# Patient Record
Sex: Female | Born: 1955 | Race: White | Hispanic: Yes | Marital: Married | Smoking: Never smoker
Health system: Southern US, Community
[De-identification: ages and names within clinical notes are randomized; demographics above are authoritative.]

## PROBLEM LIST (undated history)

## (undated) DIAGNOSIS — F32A Depression, unspecified: Secondary | ICD-10-CM

## (undated) HISTORY — PX: CARPAL TUNNEL RELEASE: SHX101

## (undated) HISTORY — PX: OTHER SURGICAL HISTORY: SHX169

## (undated) HISTORY — DX: Depression, unspecified: F32.A

## (undated) HISTORY — PX: ABDOMINAL HYSTERECTOMY: SHX81

---

## 2015-04-22 DIAGNOSIS — M7502 Adhesive capsulitis of left shoulder: Secondary | ICD-10-CM

## 2015-04-22 HISTORY — DX: Adhesive capsulitis of left shoulder: M75.02

## 2020-08-01 ENCOUNTER — Ambulatory Visit
Admission: RE | Admit: 2020-08-01 | Discharge: 2020-08-01 | Disposition: A | Discharge status: Home / Self Care | Payer: BC Managed Care – PPO | Attending: Pain Medicine | Admitting: Pain Medicine

## 2020-08-01 ENCOUNTER — Ambulatory Visit
Admission: RE | Admit: 2020-08-01 | Discharge: 2020-08-01 | Disposition: A | Discharge status: Home / Self Care | Payer: BC Managed Care – PPO | Source: Ambulatory Visit | Attending: Pain Medicine | Admitting: Pain Medicine

## 2020-08-01 ENCOUNTER — Encounter: Payer: Self-pay | Admitting: Pain Medicine

## 2020-08-01 ENCOUNTER — Ambulatory Visit (HOSPITAL_BASED_OUTPATIENT_CLINIC_OR_DEPARTMENT_OTHER): Payer: BC Managed Care – PPO | Admitting: Pain Medicine

## 2020-08-01 VITALS — BP 124/83 | HR 62 | Temp 97.3°F | Resp 18 | Ht 61.0 in | Wt 150.0 lb

## 2020-08-01 DIAGNOSIS — M7542 Impingement syndrome of left shoulder: Secondary | ICD-10-CM

## 2020-08-01 DIAGNOSIS — M79652 Pain in left thigh: Secondary | ICD-10-CM | POA: Insufficient documentation

## 2020-08-01 DIAGNOSIS — R29898 Other symptoms and signs involving the musculoskeletal system: Secondary | ICD-10-CM | POA: Insufficient documentation

## 2020-08-01 DIAGNOSIS — R200 Anesthesia of skin: Secondary | ICD-10-CM

## 2020-08-01 DIAGNOSIS — S43422A Sprain of left rotator cuff capsule, initial encounter: Secondary | ICD-10-CM | POA: Insufficient documentation

## 2020-08-01 DIAGNOSIS — M25512 Pain in left shoulder: Secondary | ICD-10-CM

## 2020-08-01 DIAGNOSIS — M75102 Unspecified rotator cuff tear or rupture of left shoulder, not specified as traumatic: Secondary | ICD-10-CM | POA: Insufficient documentation

## 2020-08-01 DIAGNOSIS — M545 Low back pain, unspecified: Secondary | ICD-10-CM | POA: Insufficient documentation

## 2020-08-01 DIAGNOSIS — G8929 Other chronic pain: Secondary | ICD-10-CM | POA: Insufficient documentation

## 2020-08-01 DIAGNOSIS — S43429A Sprain of unspecified rotator cuff capsule, initial encounter: Secondary | ICD-10-CM | POA: Insufficient documentation

## 2020-08-01 DIAGNOSIS — M25552 Pain in left hip: Secondary | ICD-10-CM | POA: Insufficient documentation

## 2020-08-01 DIAGNOSIS — Z9889 Other specified postprocedural states: Secondary | ICD-10-CM | POA: Insufficient documentation

## 2020-08-01 DIAGNOSIS — G5602 Carpal tunnel syndrome, left upper limb: Secondary | ICD-10-CM | POA: Insufficient documentation

## 2020-08-01 DIAGNOSIS — IMO0002 Reserved for concepts with insufficient information to code with codable children: Secondary | ICD-10-CM

## 2020-08-01 HISTORY — DX: Unspecified rotator cuff tear or rupture of left shoulder, not specified as traumatic: M75.102

## 2020-08-01 HISTORY — DX: Impingement syndrome of left shoulder: M75.42

## 2020-08-01 HISTORY — DX: Reserved for concepts with insufficient information to code with codable children: IMO0002

## 2020-08-01 HISTORY — DX: Carpal tunnel syndrome, left upper limb: G56.02

## 2020-08-01 HISTORY — DX: Pain in left shoulder: M25.512

## 2020-08-01 MED ORDER — GABAPENTIN 100 MG PO CAPS: 100.0000 mg | ORAL_CAPSULE | Freq: Every day | ORAL | 0 refills | Status: AC

## 2020-08-01 MED ORDER — PREDNISONE 20 MG PO TABS: ORAL_TABLET | ORAL | 0 refills | Status: AC

## 2020-08-01 NOTE — Progress Notes (Signed)
Patient: Sherri Schroeder  Service Category: E/M  Provider: Gaspar Cola, MD  DOB: 11/23/1955  DOS: 08/01/2020  Referring Provider: No ref. provider found  MRN: [id number]  Setting: Ambulatory outpatient  PCP: Pcp, No  Type: New Patient  Specialty: Interventional Pain Management    Location: Office  Delivery: Face-to-face     Primary Reason(s) for Visit: Encounter for initial evaluation of one or more chronic problems (new to examiner) potentially causing chronic pain, and posing a threat to normal musculoskeletal function. (Level of risk: High) CC: Back Pain (Lower left) and Leg Pain (Left leg)  HPI  Sherri Schroeder is a 65 y.o. year old, female patient, who comes for the first time to our practice referred by No ref. provider found for our initial evaluation of her chronic pain. She has Acute hip pain (Left); Acute thigh pain (Left); Numbness of anterior thigh (Left); Chronic low back pain (intermittent) (Right) w/o sciatica; Weakness of lower extremity (intermittent) (Left); History of carpal tunnel surgery of wrist (Left); Midline low back pain w/o sciatica; and Sprain of rotator cuff capsule (Left) on their problem list. Today she comes in for evaluation of her Back Pain (Lower left) and Leg Pain (Left leg)  Pain Assessment: Location: Left Leg Radiating: radiates from left lower back down left leg to knee Onset: More than a month ago Duration: Chronic pain Quality: Aching, Burning, Spasm, Numbness Severity: 3 /10 (subjective, self-reported pain score)  Effect on ADL: limits activities Timing: Intermittent Modifying factors: rest BP: 124/83  HR: 62  Onset and Duration: Sudden, Date of onset: 07/30/2020, and Present less than 3 months Cause of pain:  Picking up her grandchild. Severity: NAS-11 at its worse: 9/10 Timing: Not influenced by the time of the day Aggravating Factors: Walking Alleviating Factors: Resting Associated Problems: Numbness Quality of Pain: Numb Previous  Examinations or Tests: The patient denies treatment Previous Treatments: The patient denies treatment  According to the patient, a couple of days ago she was lifting her grandchild and after that she began to experience pain in the lower back and left lower extremity.  She tried taking some over-the-counter medications but nothing seems to relieve the pain.  The primary area of pain is that of the left anterior thigh with constant numbness, intermittent weakness, and pain that worsens with prolonged standing.  Sitting and laying down seems to ease the pain, but numbness does not seem to be going away.  She denies any pain numbness or weakness at or below the knee level on either side.  Physical exam: Negative straight leg raise for reproduction of any lower extremity pain.  However straight leg raise of the left lower extremity seems to trigger pain in the right lower back.  She indicates having a longstanding history of intermittent right lower back pain that she normally treats with over-the-counter medications.  The patient was able to toe walk and heel walk without any problems.  Patrick maneuver was negative on the right side but positive on the left side for hip joint arthralgia with decreased range of motion.  Hyperextension maneuver of the lumbar spine trigger pain in the midline of the lower back without radiation towards lower extremities.  Hyperextension and rotation of the lumbar spine was positive for right-sided lumbar facet arthralgia.  Deep tendon reflexes were +3 and equal bilaterally for the patellar tendon and +2 and equal bilaterally for the Achilles tendon.  Historic Controlled Substance Pharmacotherapy Review  PMP and historical list of controlled substances: Oxycodone/APAP 5/325 for  postop pain after hysterectomy. Current opioid analgesics: None MME/day: 0 mg/day   Historical Background Evaluation: [place] PMP: PDMP reviewed during this encounter. Online review of the past 64-month period conducted.             PMP NARX Score Report:  Narcotic: 000 Sedative: 000 Stimulant: 000  Risk Assessment Profile: Personal History of Substance Abuse (SUD-Substance use disorder):  Alcohol: Negative  Illegal Drugs: Negative  Rx Drugs: Negative  ORT Risk Level calculation: Low Risk   Opioid Risk Tool - 08/01/20 1040       Family History of Substance Abuse   Alcohol Negative    Illegal Drugs Negative    Rx Drugs Negative      Personal History of Substance Abuse   Alcohol Negative    Illegal Drugs Negative    Rx Drugs Negative      Age   Age between 111-45years  No      History of Preadolescent Sexual Abuse   History of Preadolescent Sexual Abuse Negative or Female      Psychological Disease   Psychological Disease Negative    Depression Positive      Total Score   Opioid Risk Tool Scoring 1    Opioid Risk Interpretation Low Risk            ORT Scoring interpretation table:  Score <3 = Low Risk for SUD  Score between 4-7 = Moderate Risk for SUD  Score >8 = High Risk for Opioid Abuse   PHQ-2 Depression Scale:  Total score: 0  PHQ-2 Scoring interpretation table: (Score and probability of major depressive disorder)  Score 0 = No depression  Score 1 = 15.4% Probability  Score 2 = 21.1% Probability  Score 3 = 38.4% Probability  Score 4 = 45.5% Probability  Score 5 = 56.4% Probability  Score 6 = 78.6% Probability   PHQ-9 Depression Scale:  Total score: 0  PHQ-9 Scoring interpretation table:  Score 0-4 = No depression  Score 5-9 = Mild depression  Score 10-14 = Moderate depression  Score 15-19 = Moderately severe depression  Score 20-27 = Severe depression (2.4 times higher risk of SUD and 2.89 times higher risk of overuse)   Meds   Current Outpatient Medications:    buPROPion (WELLBUTRIN XL) 300 MG 24 hr tablet, Take 300 mg by mouth daily., Disp: , Rfl:    gabapentin (NEURONTIN) 100 MG capsule, Take 1-3 capsules (100-300 mg total) by mouth at  bedtime. Follow written titration schedule., Disp: 90 capsule, Rfl: 0   levothyroxine (SYNTHROID) 125 MCG tablet, Take 125 mcg by mouth daily before breakfast., Disp: , Rfl:    predniSONE (DELTASONE) 20 MG tablet, Take 3 tablets (60 mg total) by mouth daily with breakfast for 3 days, THEN 2 tablets (40 mg total) daily with breakfast for 3 days, THEN 1 tablet (20 mg total) daily with breakfast for 3 days., Disp: 18 tablet, Rfl: 0   bimatoprost (LATISSE) 0.03 % ophthalmic solution, SMARTSIG:Sparingly In Eye(s) Every Night, Disp: , Rfl:    Estradiol-Norethindrone Acet 0.5-0.1 MG tablet, estradiol-norethindrone acet 0.5 mg-0.1 mg tablet, Disp: , Rfl:   Imaging Review   Complexity Note: [place]               ROS  Cardiovascular: No reported cardiovascular signs or symptoms such as High blood pressure, coronary artery disease, abnormal heart rate or rhythm, heart attack, blood thinner therapy or heart  weakness and/or failure Pulmonary or Respiratory: No reported pulmonary signs or symptoms such as wheezing and difficulty taking a deep full breath (Asthma), difficulty blowing air out (Emphysema), coughing up mucus (Bronchitis), persistent dry cough, or temporary stoppage of breathing during sleep Neurological: No reported neurological signs or symptoms such as seizures, abnormal skin sensations, urinary and/or fecal incontinence, being born with an abnormal open spine and/or a tethered spinal cord Psychological-Psychiatric: Depressed Gastrointestinal: No reported gastrointestinal signs or symptoms such as vomiting or evacuating blood, reflux, heartburn, alternating episodes of diarrhea and constipation, inflamed or scarred liver, or pancreas or irrregular and/or infrequent bowel movements Genitourinary: No reported renal or genitourinary signs or symptoms such as difficulty voiding or producing urine, peeing blood, non-functioning kidney, kidney  stones, difficulty emptying the bladder, difficulty controlling the flow of urine, or chronic kidney disease Hematological: No reported hematological signs or symptoms such as prolonged bleeding, low or poor functioning platelets, bruising or bleeding easily, hereditary bleeding problems, low energy levels due to low hemoglobin or being anemic Endocrine: No reported endocrine signs or symptoms such as high or low blood sugar, rapid heart rate due to high thyroid levels, obesity or weight gain due to slow thyroid or thyroid disease Rheumatologic: No reported rheumatological signs and symptoms such as fatigue, joint pain, tenderness, swelling, redness, heat, stiffness, decreased range of motion, with or without associated rash Musculoskeletal: Negative for myasthenia gravis, muscular dystrophy, multiple sclerosis or malignant hyperthermia Work History: Working full time  Allergies  Sherri Schroeder is allergic to penicillins.  Laboratory Chemistry Profile   Renal No results found for: BUN, CREATININE, LABCREA, BCR, GFR, GFRAA, GFRNONAA, SPECGRAV, PHUR, PROTEINUR   Electrolytes No results found for: NA, K, CL, CALCIUM, MG, PHOS   Hepatic No results found for: AST, ALT, ALBUMIN, ALKPHOS, AMYLASE, LIPASE, AMMONIA   ID No results found for: LYMEIGGIGMAB, HIV, SARSCOV2NAA, STAPHAUREUS, MRSAPCR, HCVAB, PREGTESTUR, RMSFIGG, QFVRPH1IGG, QFVRPH2IGG, LYMEIGGIGMAB   Bone No results found for: VD25OH, RW431VQ0GQQ, PY1950DT2, IZ1245YK9, 25OHVITD1, 25OHVITD2, 25OHVITD3, TESTOFREE, TESTOSTERONE   Endocrine No results found for: GLUCOSE, GLUCOSEU, HGBA1C, TSH, FREET4, TESTOFREE, TESTOSTERONE, SHBG, ESTRADIOL, ESTRADIOLPCT, ESTRADIOLFRE, LABPREG, ACTH, CRTSLPL, UCORFRPERLTR, UCORFRPERDAY, CORTISOLBASE, LABPREG   Neuropathy No results found for: VITAMINB12, FOLATE, HGBA1C, HIV   CNS No results found for: COLORCSF, APPEARCSF, RBCCOUNTCSF, WBCCSF, POLYSCSF, LYMPHSCSF, EOSCSF, PROTEINCSF, GLUCCSF, JCVIRUS,  CSFOLI, IGGCSF, LABACHR, ACETBL, LABACHR, ACETBL   Inflammation (CRP: Acute  ESR: Chronic) No results found for: CRP, ESRSEDRATE, LATICACIDVEN   Rheumatology No results found for: RF, ANA, LABURIC, URICUR, LYMEIGGIGMAB, LYMEABIGMQN, HLAB27   Coagulation No results found for: INR, LABPROT, APTT, PLT, DDIMER, LABHEMA, VITAMINK1, AT3   Cardiovascular No results found for: BNP, CKTOTAL, CKMB, TROPONINI, HGB, HCT, LABVMA, EPIRU, EPINEPH24HUR, NOREPRU, NOREPI24HUR, DOPARU, DOPAM24HRUR   Screening No results found for: [place], COVIDSOURCE, STAPHAUREUS, MRSAPCR, HCVAB, HIV, PREGTESTUR   Cancer No results found for: CEA, CA125, LABCA2   Allergens No results found for: ALMOND, Schroeder, ASPARAGUS, AVOCADO, BANANA, BARLEY, BASIL, BAYLEAF, GREENBEAN, LIMABEAN, WHITEBEAN, BEEFIGE, REDBEET, BLUEBERRY, BROCCOLI, CABBAGE, MELON, CARROT, CASEIN, CASHEWNUT, CAULIFLOWER, CELERY     Note: No results found under the [employer] electronic medical record  [facility]  Drug: Sherri Schroeder  reports no history of drug use. Alcohol:  reports current alcohol use. Tobacco:  has no history on file for tobacco use. 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 Family: family history includes COPD in her mother; Cancer in her father and mother; Heart disease in her mother.  Past Surgical History:  Procedure Laterality Date   ABDOMINAL HYSTERECTOMY     ataracts Bilateral    CARPAL TUNNEL RELEASE Left    Active Ambulatory Problems    Diagnosis Date Noted   Acute hip pain (Left) 08/01/2020   Acute thigh pain (Left) 08/01/2020   Numbness of anterior thigh (Left) 08/01/2020   Chronic low back pain (intermittent) (Right) w/o sciatica  08/01/2020   Weakness of lower extremity (intermittent) (Left) 08/01/2020   History of carpal tunnel surgery of wrist (Left) 08/01/2020   Midline low back pain w/o sciatica 08/01/2020   Sprain of rotator cuff capsule (Left) 08/01/2020   Resolved Ambulatory Problems    Diagnosis Date Noted   Rotator cuff syndrome of shoulder and allied disorders (Left) 08/01/2020   Sprain of rotator cuff capsule 08/01/2020   Adhesive capsulitis of shoulder (Left) 04/22/2015   Impingement syndrome of shoulder region (Left) 08/01/2020   Acute pain of shoulder (Left) 08/01/2020   Carpal tunnel syndrome (Left) 08/01/2020   Past Medical History:  Diagnosis Date   Depression    Constitutional Exam  General appearance: Well nourished, well developed, and well hydrated. In no apparent acute distress Vitals:   08/01/20 1031  BP: 124/83  Pulse: 62  Resp: 18  Temp: (!) 97.3 F (36.3 C)  SpO2: 96%  Weight: 150 lb (68 kg)  Height: _0  (1.549 m)   BMI Assessment: Estimated body mass index is 28.34 kg/m as calculated from the following:   Height as of this encounter: _1  (1.549 m).   Weight as of this encounter: 150 lb (68 kg).  BMI interpretation table: BMI level Category Range association with higher incidence of chronic pain  <18 kg/m2 Underweight   18.5-24.9 kg/m2 Ideal body weight   25-29.9 kg/m2 Overweight Increased incidence by 20%  30-34.9 kg/m2 Obese (Class I) Increased incidence by 68%  35-39.9 kg/m2 Severe obesity (Class II) Increased incidence by 136%  >40 kg/m2 Extreme obesity (Class III) Increased incidence by 254%   Patient's current BMI Ideal Body weight  Body mass index is 28.34 kg/m. Ideal body weight: 47.8 kg (105 lb 6.1 oz) Adjusted ideal body weight: 55.9 kg (123 lb 3.7 oz)   BMI Readings from Last 4 Encounters:  08/01/20 28.34 kg/m   Wt Readings from Last 4 Encounters:  08/01/20 150 lb (68 kg)    Psych/Mental status: Alert, oriented x 3 (person, place, & time)        Eyes: PERLA Respiratory: No evidence of acute respiratory distress  Assessment  Primary Diagnosis & Pertinent Problem List: The primary encounter diagnosis was Numbness of left anterior thigh. Diagnoses of Acute thigh pain, left, Acute hip pain, left, Weakness of left lower extremity, Acute midline low back pain without sciatica, and History of carpal tunnel surgery of left wrist were also pertinent to this visit.  Visit Diagnosis (New problems to examiner): 1. Numbness of left anterior thigh   2. Acute thigh pain, left   3. Acute hip pain, left   4. Weakness of left lower extremity   5. Acute midline low back pain without sciatica   6. History of carpal tunnel surgery of left wrist    Plan of Care (Initial workup plan)  Note: Sherri Schroeder was reminded that as per protocol, today's visit has been an evaluation only. We have  not taken over the patient's controlled substance management.  Problem-specific plan: No problem-specific Assessment & Plan notes found for this encounter. Lab Orders  No laboratory test(s) ordered today   Imaging Orders  DG HIP UNILAT W OR W/O PELVIS 2-3 VIEWS LEFT  DG Lumbar Spine Complete W/Bend   Referral Orders  No referral(s) requested today   Procedure Orders    No procedure(s) ordered today   Pharmacotherapy (current): Medications ordered:  Meds ordered this encounter  Medications   predniSONE (DELTASONE) 20 MG tablet    Sig: Take 3 tablets (60 mg total) by mouth daily with breakfast for 3 days, THEN 2 tablets (40 mg total) daily with breakfast for 3 days, THEN 1 tablet (20 mg total) daily with breakfast for 3 days.    Dispense:  18 tablet    Refill:  0   gabapentin (NEURONTIN) 100 MG capsule    Sig: Take 1-3 capsules (100-300 mg total) by mouth at bedtime. Follow written titration schedule.    Dispense:  90 capsule    Refill:  0    Fill one day early if pharmacy is closed on scheduled refill date. May substitute for generic if available.     Medications administered during this visit: Sherri Schroeder had no medications administered during this visit.   Pharmacological management options:  Opioid Analgesics: The patient was informed that there is no guarantee that she would be a candidate for opioid analgesics. The decision will be made following CDC guidelines. This decision will be based on the results of diagnostic studies, as well as Sherri Schroeder's risk profile.   Membrane stabilizer: To be determined at a later time  Muscle relaxant: To be determined at a later time  NSAID: To be determined at a later time  Other analgesic(s): To be determined at a later time   Interventional management options: Sherri Schroeder was informed that there is no guarantee that she would be a candidate for interventional therapies. The decision will be based on the results of diagnostic studies, as well as Sherri Schroeder's risk profile.  Procedure(s) under consideration:  Pending results of x-rays.   Provider-requested follow-up: Return if symptoms worsen or fail to improve.  No future appointments.   Note by: Gaspar Cola, MD Date: 08/01/2020; Time: 12:05 PM

## 2020-08-01 NOTE — Progress Notes (Signed)
Safety precautions to be maintained throughout the outpatient stay will include: orient to surroundings, keep bed in low position, maintain call bell within reach at all times, provide assistance with transfer out of bed and ambulation.  

## 2020-08-01 NOTE — Patient Instructions (Signed)
____________________________________________________________________________________________  Pain Prevention Technique  Definition:   A technique used to minimize the effects of an activity known to cause inflammation or swelling, which in turn leads to an increase in pain.  Purpose: To prevent swelling from occurring. It is based on the fact that it is easier to prevent swelling from happening than it is to get rid of it, once it occurs.  Contraindications: Anyone with allergy or hypersensitivity to the recommended medications. Anyone taking anticoagulants (Blood Thinners) (e.g., Coumadin, Warfarin, Plavix, etc.). Patients in Renal Failure.  Technique: Before you undertake an activity known to cause pain, or a flare-up of your chronic pain, and before you experience any pain, do the following:  On a full stomach, take 4 (four) over the counter Ibuprofens 200mg  tablets (Motrin), for a total of 800 mg. In addition, take over the counter Magnesium 400 to 500 mg, before doing the activity.  Six (6) hours later, again on a full stomach, repeat the Ibuprofen. That night, take a warm shower and stretch under the running warm water.  This technique may be sufficient to abort the pain and discomfort before it happens. Keep in mind that it takes a lot less medication to prevent swelling than it takes to eliminate it once it occurs.  ____________________________________________________________________________________________  ____________________________________________________________________________________________  Muscle Spasms & Cramps  Cause:  The most common cause of muscle spasms and cramps is vitamin and/or electrolyte (calcium, potassium, sodium, etc.) deficiencies.  Possible triggers: Sweating - causes loss of electrolytes thru the skin. Steroids - causes loss of electrolytes thru the urine.  Treatment: Gatorade (or any other electrolyte-replenishing drink) - Take 1, 8 oz glass  with each meal (3 times a day). OTC (over-the-counter) Magnesium 400 to 500 mg - Take 1 tablet twice a day (one with breakfast and one before bedtime). If you have kidney problems, talk to your primary care physician before taking any Magnesium. Tonic Water with quinine - Take 1, 8 oz glass before bedtime.   ____________________________________________________________________________________________  ____________________________________________________________________________________________  Virtual Visits   What is a "Virtual Visit"? It is a (medical visit) that takes place on real time (NOT TEXT or E-MAIL) over the telephone or computer device (desktop, laptop, tablet, smart phone, etc.). It allows for more location flexibility between the patient and the healthcare provider.  Who decides when these types of visits will be used? The physician.  Who is eligible for these types of visits? Only those patients that can be reliably reached over the telephone.  What do you mean by reliably? We do not have time to call everyone multiple times, therefore those that tend to screen calls and then call back later are not suitable candidates for this system. We understand how people are reluctant to pickup on "unknown" calls, therefore, we suggest adding our telephone numbers to your list of "CONTACT(s)". This way, you should be able to readily identify our calls when you receive one. All of our numbers are available below.   Who is not eligible? This option is not available for medication management encounters, specially for controlled substances. Patients on pain medications that fall under the category of controlled substances have to come in for "Face-to-Face" encounters. This is required for mandatory monitoring of these substances. You may be asked to provide a sample for an unannounced urine drug screening test (UDS), and we will need to count your pain pills.  Not bringing your pills to be counted may result in no refill. Obviously, neither one of  these can be done over the phone.  When will this type of visits be used? You can request a virtual visit whenever you are physically unable to attend a regular appointment. The decision will be made by the physician (or healthcare provider) on a case by case basis.   At what time will I be called? This is an excellent question. The providers will try to call you whenever they have time available. Do not expect to be called at any specific time. The secretaries will assign you a time for your virtual visit appointment, but this is done simply to keep a list of those patients that need to be called, but not for the purpose of keeping a time schedule. Be advised that the call may come in anytime during the day, between the hours of 8:00 AM and 8::00 PM, depending on provider availability. We do understand that the system is not perfect. If you are unable to be available that day on a moments notice, then request an "in-person" appointment rather than a "virtual visit".  Can I request my medication visits to be "Virtual"? Yes you may request it, but the decision is entirely up to the healthcare provider. Control substances require specific monitoring that requires Face-to-Face encounters. The number of encounters  and the extent of the monitoring is determined on a case by case basis.  Add a new contact to your smart phone and label it "PAIN CLINIC" Under this contact add the following numbers: Main: [phone] (Official Contact Number) Nurses: [phone] (These are outgoing only calling systems. Do not call this number.) Dr. Laban Emperor: [phone] or [phone] (Outgoing calls only. Do not call this number.)  ____________________________________________________________________________________________

## 2020-08-07 ENCOUNTER — Other Ambulatory Visit: Payer: Self-pay | Admitting: Pain Medicine

## 2020-08-07 DIAGNOSIS — M5410 Radiculopathy, site unspecified: Secondary | ICD-10-CM | POA: Insufficient documentation

## 2020-08-07 DIAGNOSIS — R29898 Other symptoms and signs involving the musculoskeletal system: Secondary | ICD-10-CM

## 2020-08-07 DIAGNOSIS — M47817 Spondylosis without myelopathy or radiculopathy, lumbosacral region: Secondary | ICD-10-CM | POA: Insufficient documentation

## 2020-08-07 DIAGNOSIS — M5416 Radiculopathy, lumbar region: Secondary | ICD-10-CM | POA: Insufficient documentation

## 2020-08-07 DIAGNOSIS — M5137 Other intervertebral disc degeneration, lumbosacral region: Secondary | ICD-10-CM

## 2020-08-07 DIAGNOSIS — M25552 Pain in left hip: Secondary | ICD-10-CM

## 2020-08-07 DIAGNOSIS — M79652 Pain in left thigh: Secondary | ICD-10-CM

## 2020-08-07 DIAGNOSIS — M47816 Spondylosis without myelopathy or radiculopathy, lumbar region: Secondary | ICD-10-CM

## 2020-08-07 DIAGNOSIS — R208 Other disturbances of skin sensation: Secondary | ICD-10-CM

## 2020-08-07 DIAGNOSIS — R200 Anesthesia of skin: Secondary | ICD-10-CM

## 2020-08-07 NOTE — Progress Notes (Addendum)
X-rays reviewed. Patient called. Symptoms have decreased in frequency, but they persist, despite a round of oral steroids. At this point an MRI is needed to evaluate for soft tissue pathology. Patient is experiencing pain and numbness with sensory disturbance on her left thigh region following an L2/L3 dermatomal distribution. She is experiencing intermittent weakness associated with pain episodes which are triggered with prolonged standing. Pain appears after more than 10 minutes of standing. This period is shortened when lifting any weight or movement of lumbar spine. Symptoms are highly suspicious for a far left disc herniation at the L1-2 thru L3-4 region. MRI w/o contrast is medically indicated to assess degree of neural structure compression and possible need for surgical intervention.  Imaging Review  Lumbosacral Imaging: Lumbar DG Bending views: Results for orders placed during the hospital encounter of 08/01/20 DG Lumbar Spine Complete W/Bend  Narrative CLINICAL DATA:  Low back pain  EXAM: LUMBAR SPINE - COMPLETE WITH BENDING VIEWS  COMPARISON:  None.  FINDINGS: 5 nonrib bearing lumbar-type vertebral bodies.  Vertebral body heights are maintained. No acute fracture. Generalized osteopenia.  No static listhesis. No spondylolysis.  Degenerative disease with disc height loss at L2-3, L3-4, L4-5 and L5-S1. Bilateral facet arthropathy at L4-5 and L5-S1.  SI joints are unremarkable.  IMPRESSION: Lumbar spine spondylosis as described above.   Electronically Signed By: Elige Ko On: 08/03/2020 08:07  Hip Imaging: Hip-L DG 2-3 views: Results for orders placed during the hospital encounter of 08/01/20 DG HIP UNILAT W OR W/O PELVIS 2-3 VIEWS LEFT  Narrative CLINICAL DATA:  Left hip pain for 1 year  EXAM: DG HIP (WITH OR WITHOUT PELVIS) 2-3V LEFT  COMPARISON:  None.  FINDINGS: Bones: No acute fracture or dislocation. Generalized osteopenia. No periostitis.  Enthesopathic changes at the iliac crest bilaterally.  Joints: Normal alignment. No erosive changes. Joint spaces are maintained.  Soft tissue: No soft tissue abnormality. No radiopaque foreign body. No chondrocalcinosis.  IMPRESSION: No significant arthropathy of the left hip.   Electronically Signed By: Elige Ko On: 08/03/2020 08:06  Complexity Note: Imaging results reviewed. Results shared with Sherri Schroeder, using Layman's terms.                         Assessment   Status Diagnosis  Persistent Persistent Not responding 1. Acute thigh pain (Left)   2. Radicular pain of lower extremity (Left)   3. Acute lumbar radiculopathy (Left)   4. Acute hip pain (Left)   5. DDD (degenerative disc disease), lumbosacral   6. Sensory abnormality of lumbar dermatome distribution (Left: L2/L3 Dermatomes)   7. Numbness of anterior thigh (Left)   8. Weakness of lower extremity (intermittent) (Left)   9. Lumbar spondylosis   10. Lumbosacral facet arthropathy (Multilevel) (Bilateral)      Updated Problems: Problem  Ddd (Degenerative Disc Disease), Lumbosacral   Degenerative disease with disc height loss at L2-3, L3-4, L4-5 and L5-S1.    Lumbosacral facet arthropathy (Multilevel) (Bilateral)   Bilateral facet arthropathy at L4-5 and L5-S1.    Lumbosacral facet joint syndrome (Right)  Lumbar Spondylosis   Degenerative disease with disc height loss at L2-3, L3-4, L4-5 and L5-S1. Bilateral facet arthropathy at L4-5 and L5-S1.    Sensory abnormality of lumbar dermatome distribution (Left: L2/L3 Dermatomes)  Acute lumbar radiculopathy (Left)  Radicular pain of lower extremity (Left)

## 2022-07-07 IMAGING — CR DG LUMBAR SPINE COMPLETE W/ BEND
7 series · 7 of 7 positions shown · non-contrast
Comparison: None.

CLINICAL DATA: Low back pain

EXAM:
LUMBAR SPINE - COMPLETE WITH BENDING VIEWS

[l-spine ap]
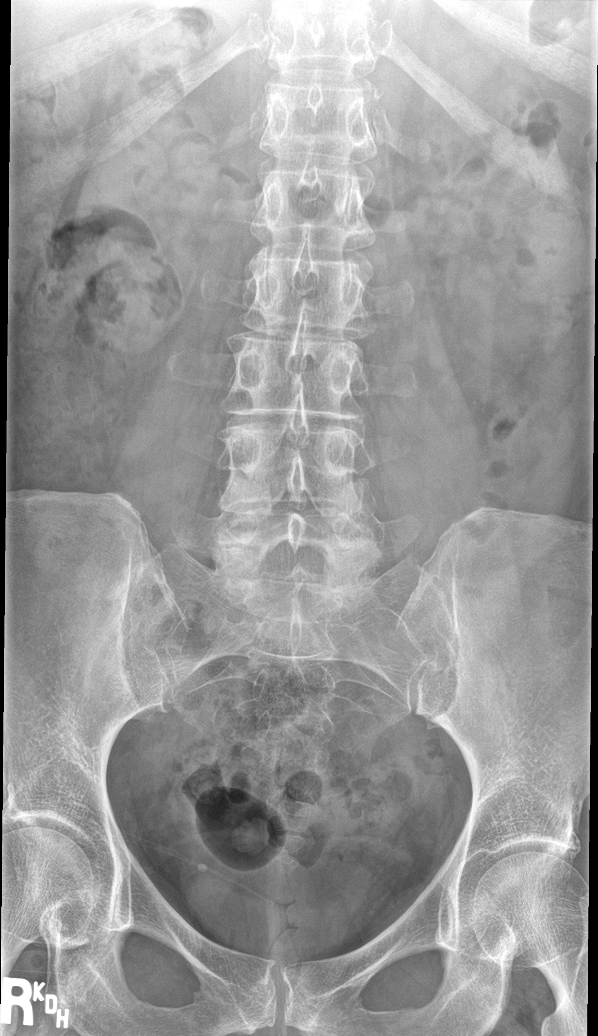

[l-spine obl (1 of 2)]
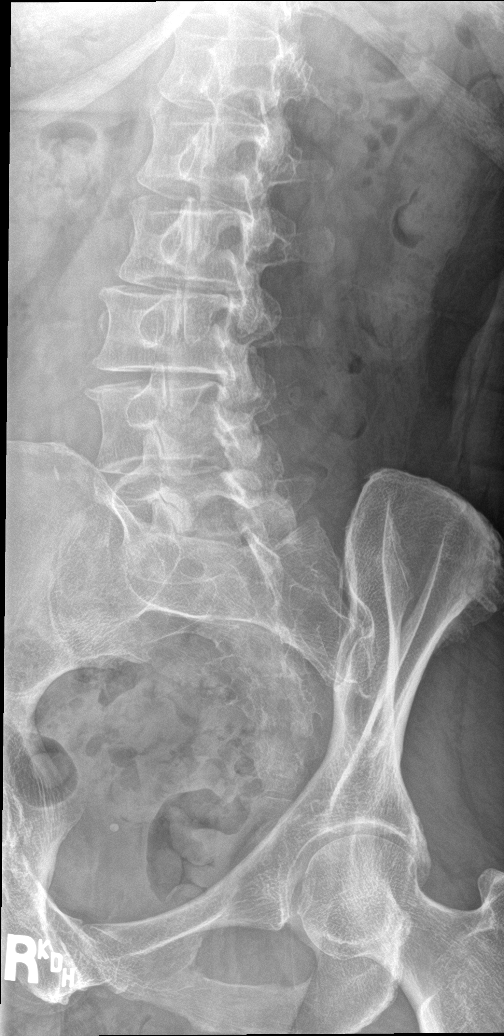

[l-spine obl (2 of 2)]
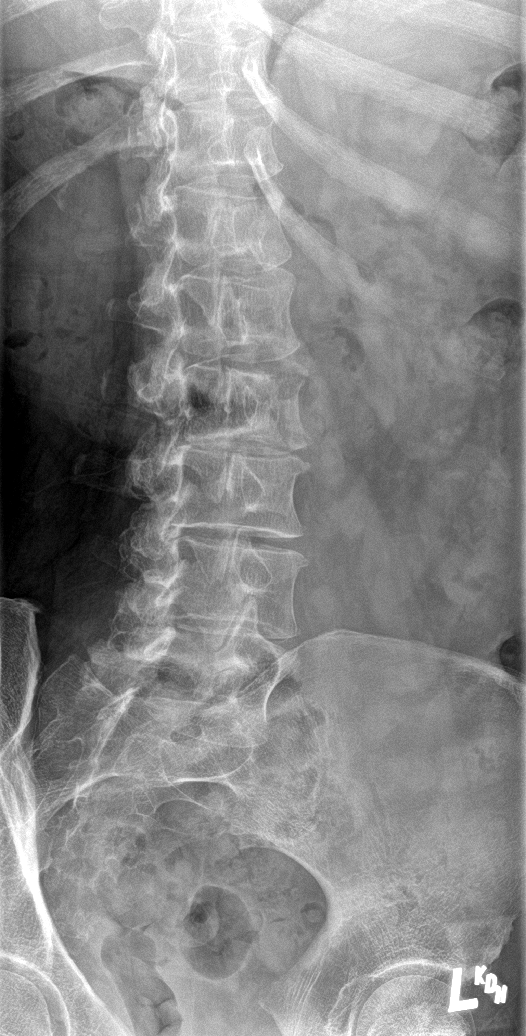

[l-spine lat]
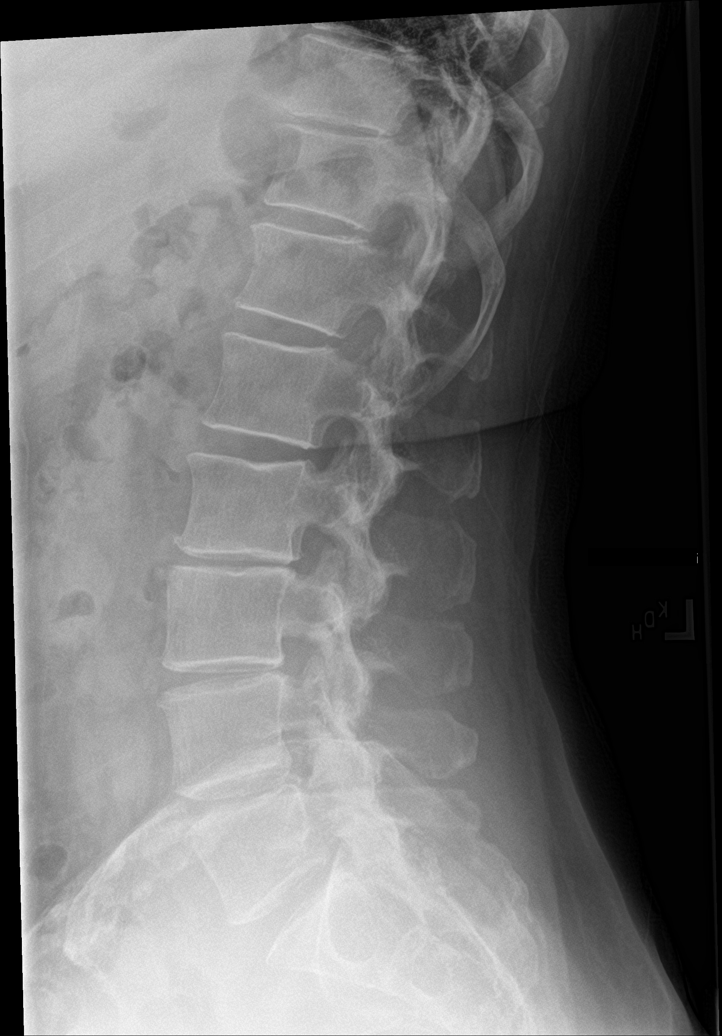

[l-spine flex]
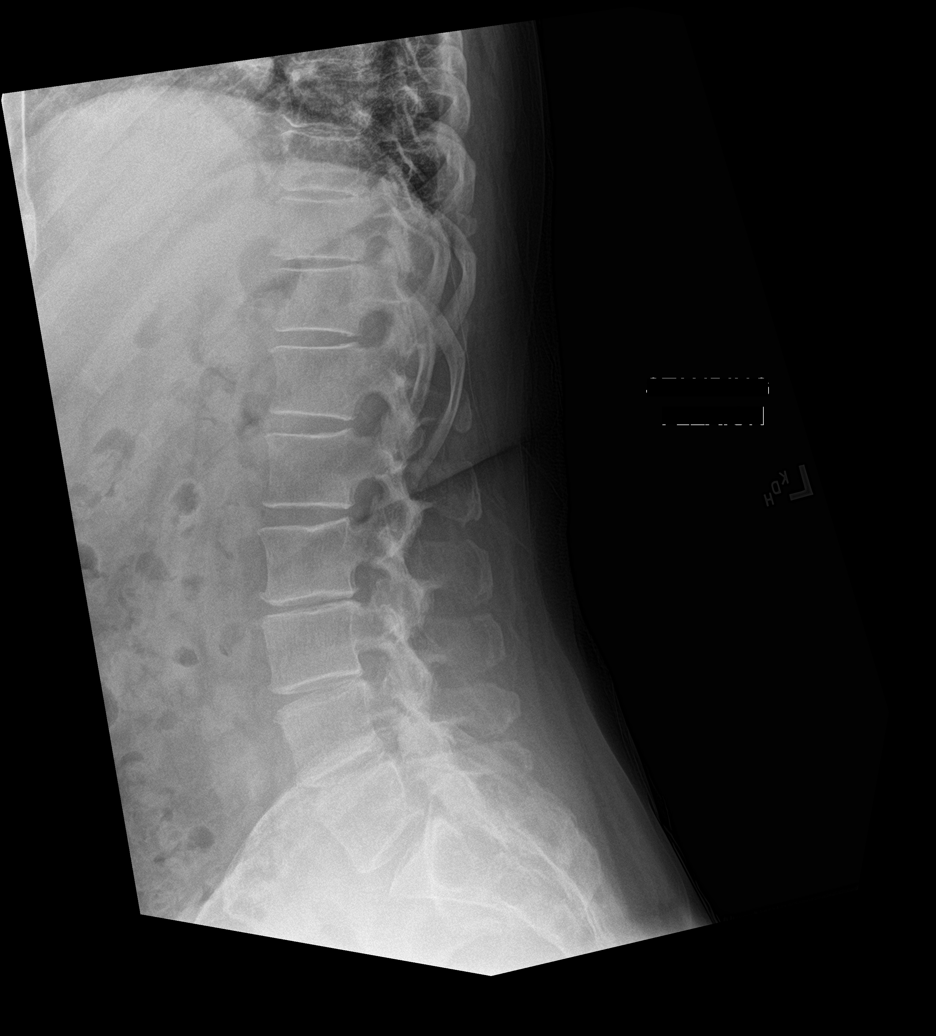

[l-spine ext]
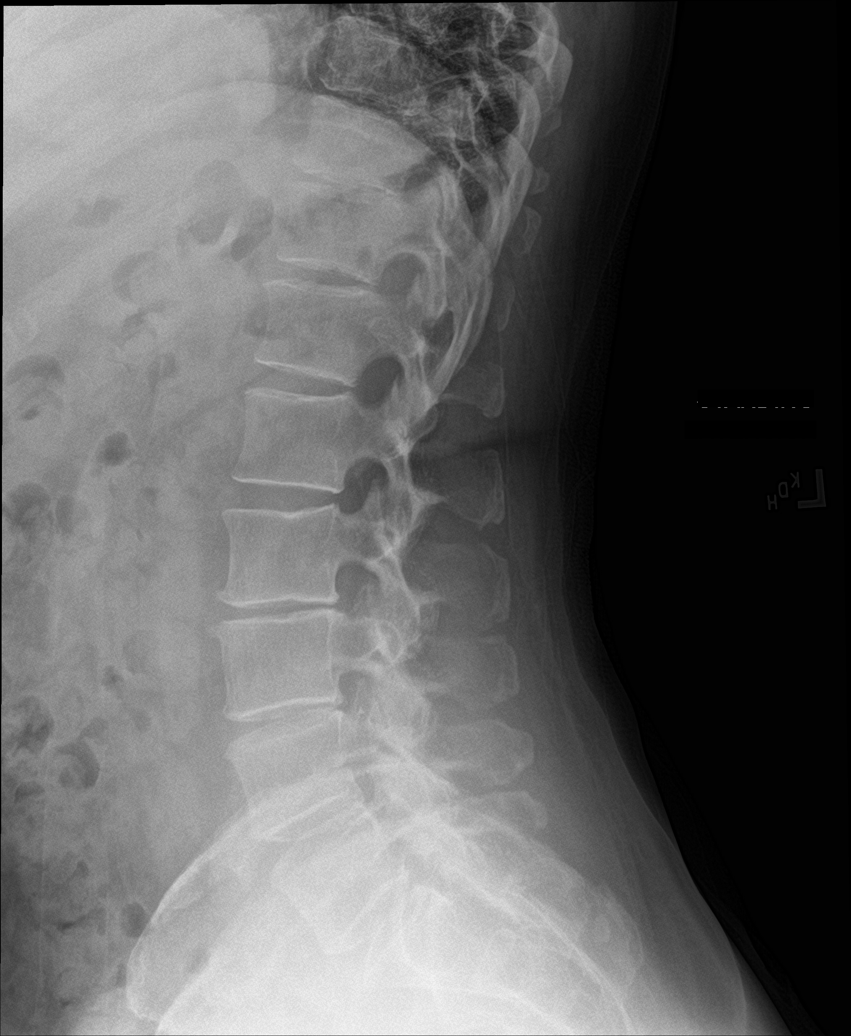

[l-spine spot]
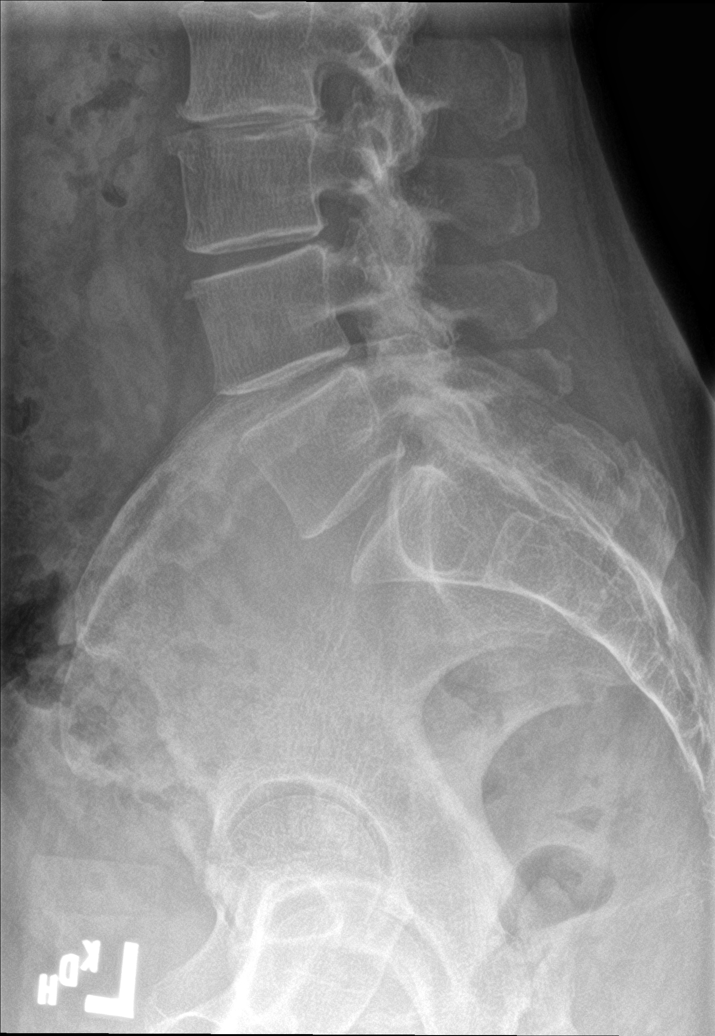

[7 of 7 positions shown; findings below may reference images not displayed]

FINDINGS: 5 nonrib bearing lumbar-type vertebral bodies.

Vertebral body heights are maintained. No acute fracture.
Generalized osteopenia.

No static listhesis. No spondylolysis.

Degenerative disease with disc height loss at L2-3, L3-4, L4-5 and
L5-S1. Bilateral facet arthropathy at L4-5 and L5-S1.

SI joints are unremarkable.
IMPRESSION: Lumbar spine spondylosis as described above.
